# Patient Record
Sex: Female | Born: 1980
Health system: Southern US, Community
[De-identification: ages and names within clinical notes are randomized; demographics above are authoritative.]

## PROBLEM LIST (undated history)

## (undated) HISTORY — PX: AUGMENTATION MAMMAPLASTY: SUR837

---

## 2002-06-15 ENCOUNTER — Other Ambulatory Visit: Admission: RE | Admit: 2002-06-15 | Discharge: 2002-06-15 | Payer: Self-pay | Admitting: Obstetrics and Gynecology

## 2003-07-02 ENCOUNTER — Inpatient Hospital Stay (HOSPITAL_COMMUNITY): Admission: AD | Admit: 2003-07-02 | Discharge: 2003-07-02 | Payer: Self-pay | Admitting: Obstetrics and Gynecology

## 2003-08-08 ENCOUNTER — Encounter: Admission: RE | Admit: 2003-08-08 | Discharge: 2003-11-06 | Payer: Self-pay | Admitting: Obstetrics & Gynecology

## 2003-10-16 ENCOUNTER — Ambulatory Visit (HOSPITAL_COMMUNITY): Admission: RE | Admit: 2003-10-16 | Discharge: 2003-10-16 | Payer: Self-pay | Admitting: Obstetrics and Gynecology

## 2003-10-16 ENCOUNTER — Inpatient Hospital Stay (HOSPITAL_COMMUNITY): Admission: AD | Admit: 2003-10-16 | Discharge: 2003-10-16 | Payer: Self-pay | Admitting: Obstetrics and Gynecology

## 2003-10-18 ENCOUNTER — Inpatient Hospital Stay (HOSPITAL_COMMUNITY): Admission: AD | Admit: 2003-10-18 | Discharge: 2003-10-21 | Payer: Self-pay | Admitting: Obstetrics and Gynecology

## 2003-11-24 ENCOUNTER — Other Ambulatory Visit: Admission: RE | Admit: 2003-11-24 | Discharge: 2003-11-24 | Payer: Self-pay | Admitting: Obstetrics and Gynecology

## 2008-01-05 ENCOUNTER — Ambulatory Visit (HOSPITAL_COMMUNITY): Admission: RE | Admit: 2008-01-05 | Discharge: 2008-01-05 | Payer: Self-pay | Admitting: Obstetrics and Gynecology

## 2008-03-28 ENCOUNTER — Inpatient Hospital Stay (HOSPITAL_COMMUNITY): Admission: RE | Admit: 2008-03-28 | Discharge: 2008-03-30 | Payer: Self-pay | Admitting: Obstetrics and Gynecology

## 2008-03-28 ENCOUNTER — Encounter (INDEPENDENT_AMBULATORY_CARE_PROVIDER_SITE_OTHER): Payer: Self-pay | Admitting: Obstetrics and Gynecology

## 2008-04-30 ENCOUNTER — Emergency Department (HOSPITAL_COMMUNITY): Admission: EM | Admit: 2008-04-30 | Discharge: 2008-04-30 | Payer: Self-pay | Admitting: Emergency Medicine

## 2008-05-07 ENCOUNTER — Ambulatory Visit (HOSPITAL_COMMUNITY): Admission: RE | Admit: 2008-05-07 | Discharge: 2008-05-07 | Payer: Self-pay | Admitting: Family Medicine

## 2008-05-08 ENCOUNTER — Ambulatory Visit (HOSPITAL_COMMUNITY): Admission: RE | Admit: 2008-05-08 | Discharge: 2008-05-08 | Payer: Self-pay | Admitting: Gastroenterology

## 2008-05-09 ENCOUNTER — Ambulatory Visit (HOSPITAL_COMMUNITY): Admission: RE | Admit: 2008-05-09 | Discharge: 2008-05-09 | Payer: Self-pay | Admitting: Gastroenterology

## 2008-05-29 ENCOUNTER — Encounter (INDEPENDENT_AMBULATORY_CARE_PROVIDER_SITE_OTHER): Payer: Self-pay | Admitting: Surgery

## 2008-05-29 ENCOUNTER — Ambulatory Visit (HOSPITAL_COMMUNITY): Admission: RE | Admit: 2008-05-29 | Discharge: 2008-05-29 | Payer: Self-pay | Admitting: Surgery

## 2009-03-15 IMAGING — US US ABDOMEN COMPLETE
1 series · 14 of 25 positions shown · non-contrast
Comparison: None

CLINICAL DATA: Rule out obstruction of gallbladder.  Elevated LFTs.
The patient has jaundice.  Epigastric pain.

ABDOMEN ULTRASOUND
TECHNIQUE: Complete abdominal ultrasound examination was performed
including evaluation of the liver, gallbladder, bile ducts,
pancreas, kidneys, spleen, IVC, and abdominal aorta.

[Series 1: unknown · 0.38mm/px · 14 of 74 slices shown]
[im 1/74]
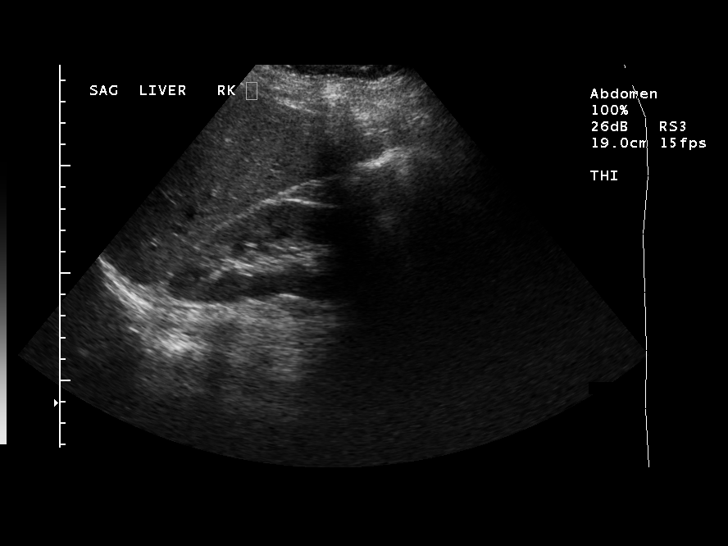
[im 7/74]
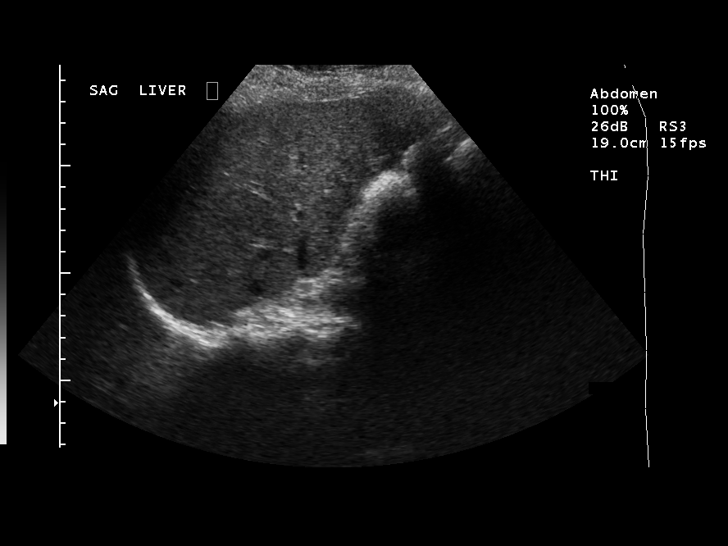
[im 13/74]
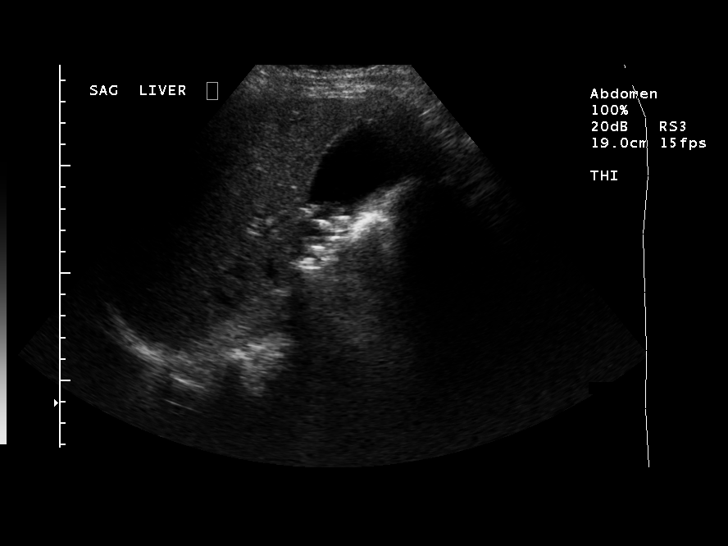
[im 19/74]
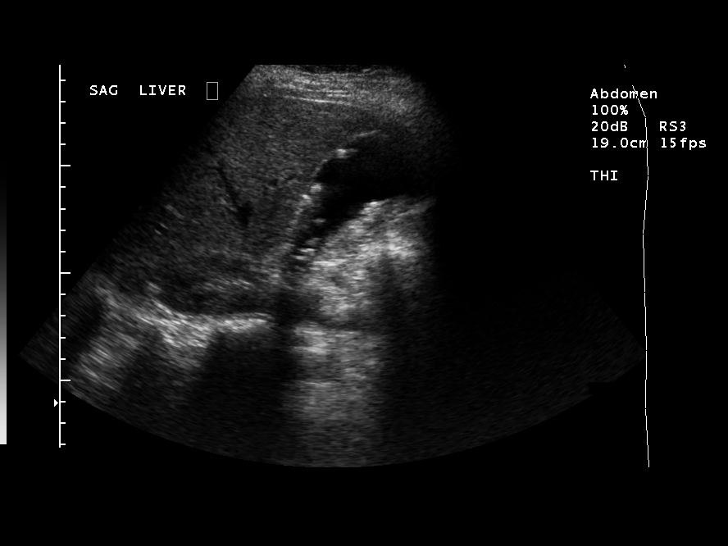
[im 25/74]
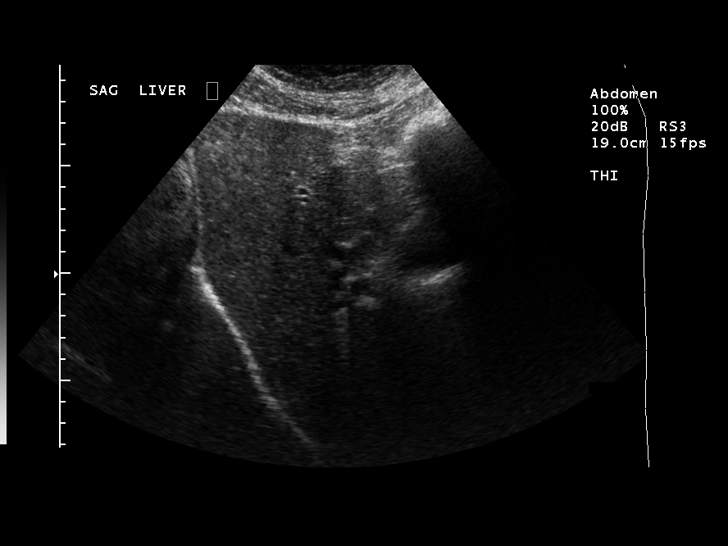
[im 28/74]
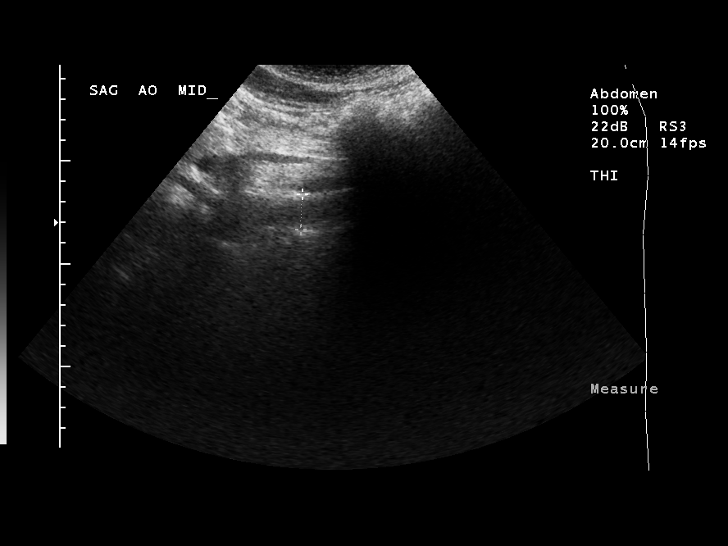
[im 34/74]
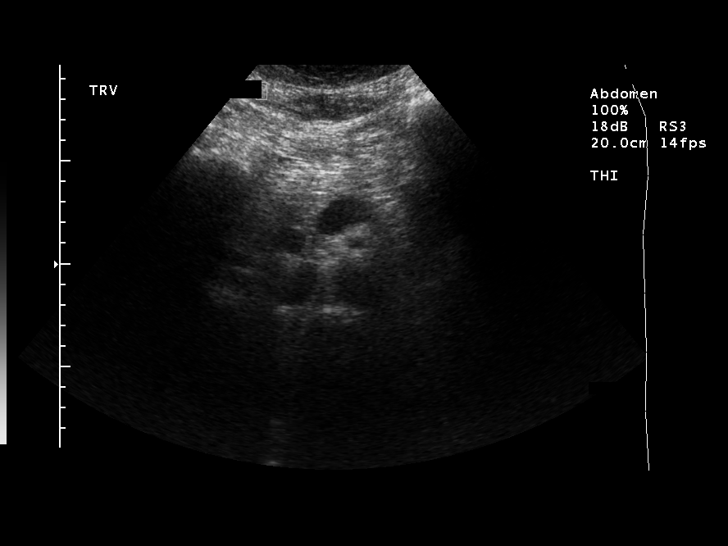
[im 40/74]
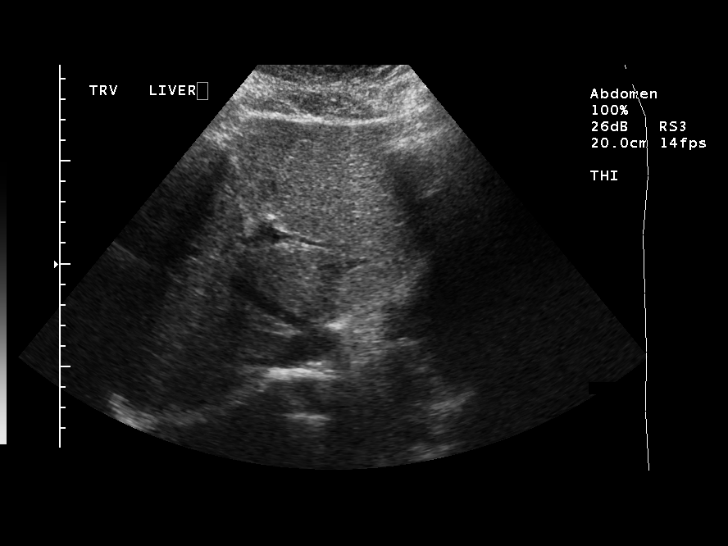
[im 46/74]
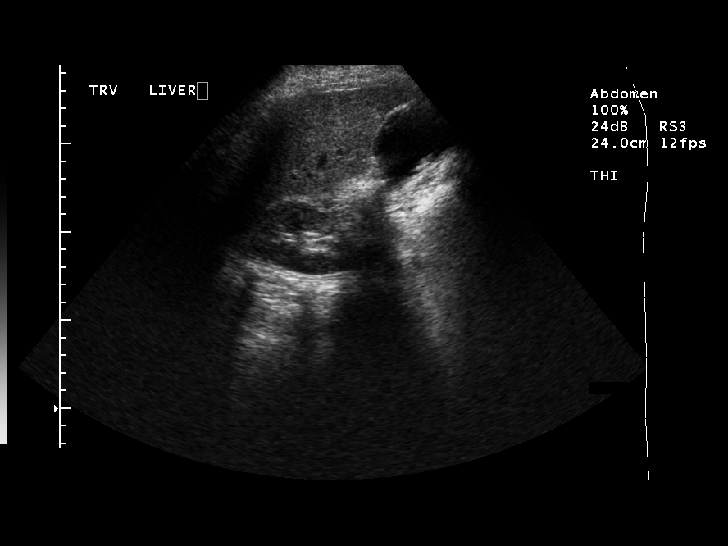
[im 49/74]
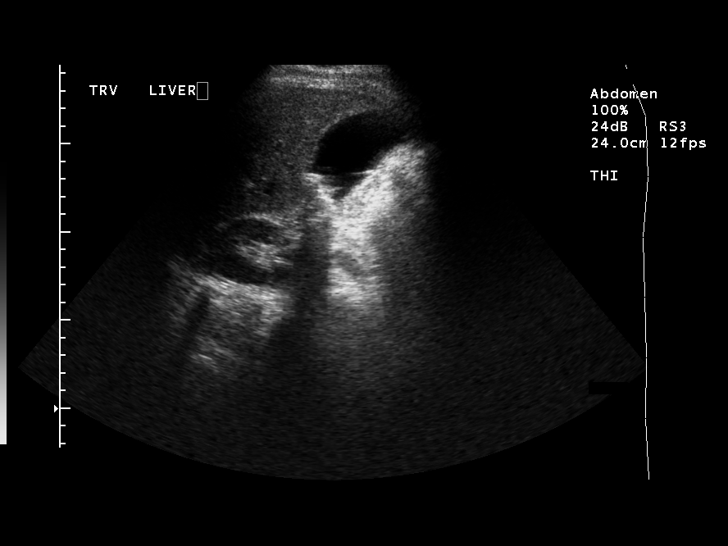
[im 55/74]
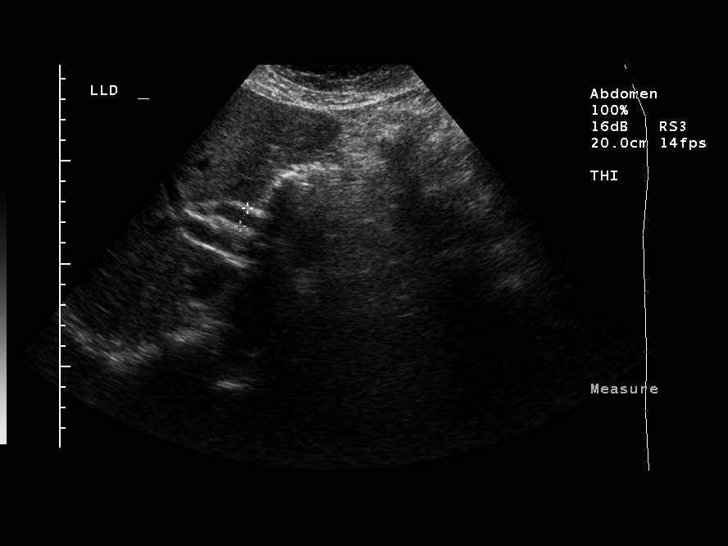
[im 61/74]
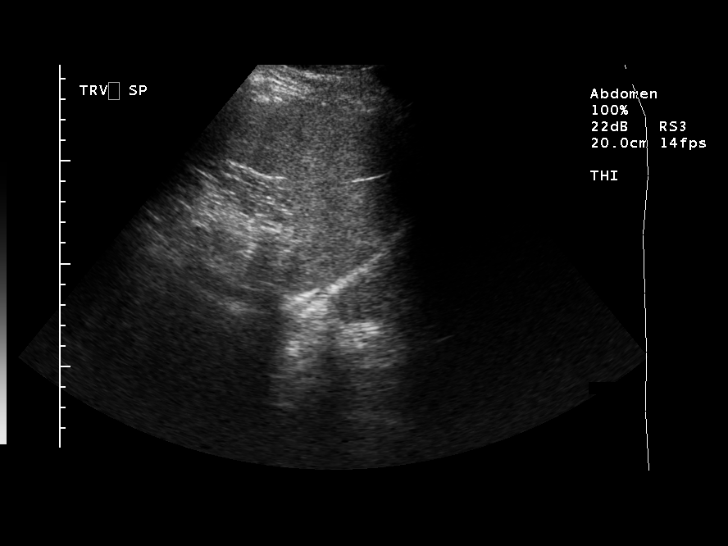
[im 67/74]
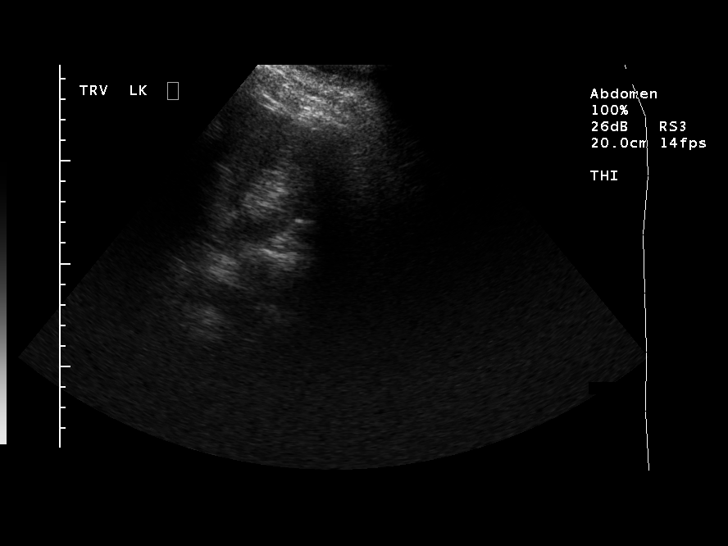
[im 74/74]
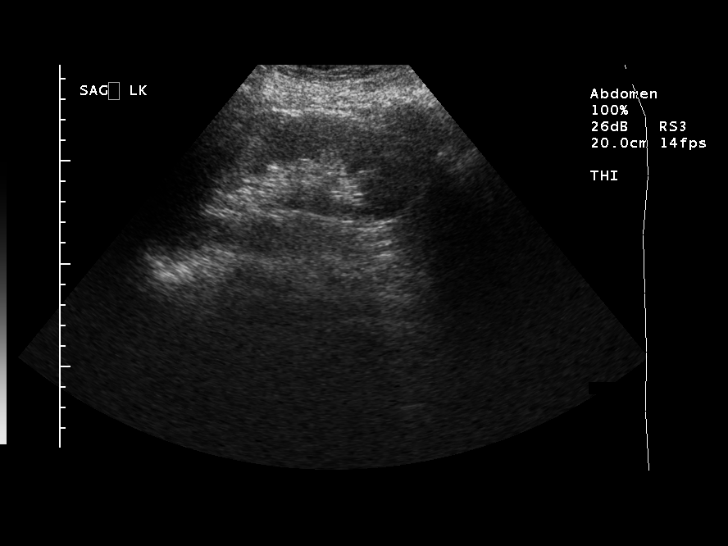

[14 of 25 positions shown; findings below may reference images not displayed]

FINDINGS: The liver is homogeneous in echotexture.  No liver mass
is identified.

Within the gallbladder, numerous stones are identified.
Gallbladder wall is normal in thickness measuring 1.6 mm.  No
sonographic Ronlor is identified.  Common bile duct is dilated
measuring 11 mm in diameter.

The inferior vena cava, visualized abdominal aorta, pancreas,
spleen, and kidneys have a normal appearance.
IMPRESSION: Gallstones and dilated common bile duct.  No sonographic Murphy's
sign.

Critical test results telephoned to Dr. Pa Tryk Baluch at the time

## 2009-03-17 IMAGING — RF DG ERCP WO/W SPHINCTEROTOMY
1 series · 5 of 5 positions shown · non-contrast
Comparison: Ultrasound 05/07/2008

CLINICAL DATA: Cough pain.

ERCP

[Series 1: run · 5 of 5 slices shown]
[im 1/5]
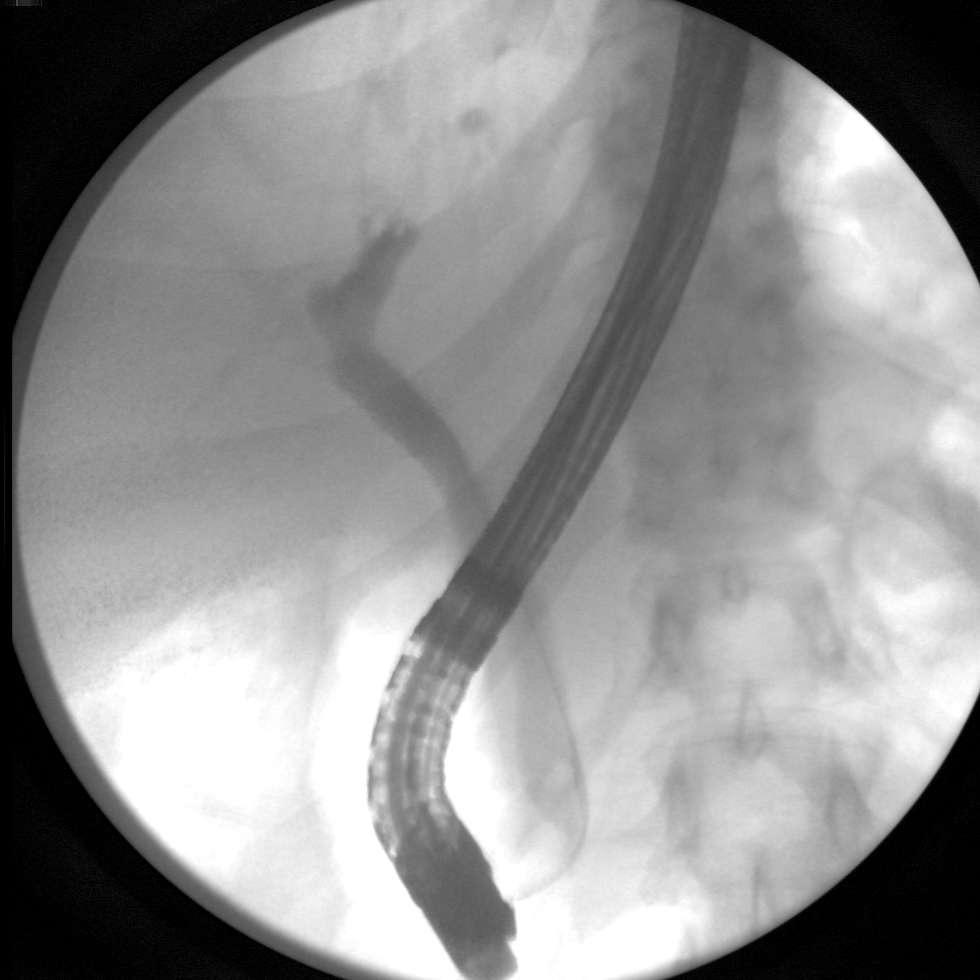
[im 2/5]
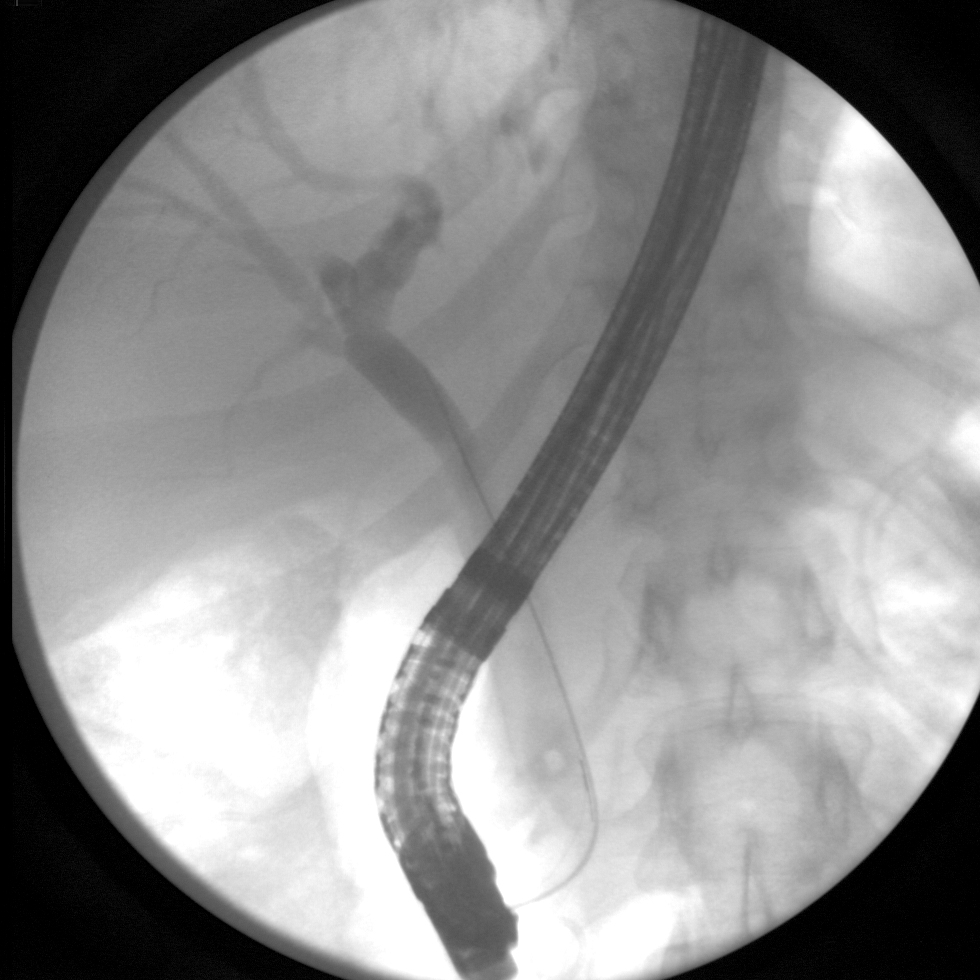
[im 3/5]
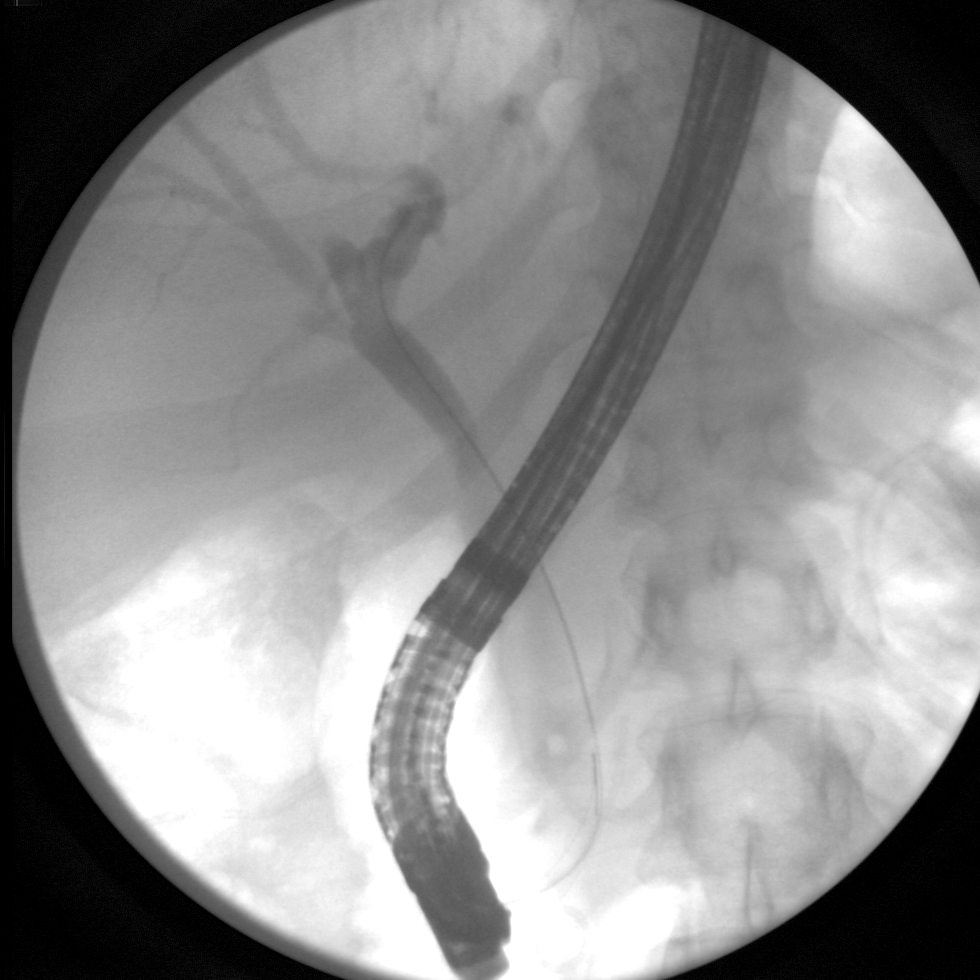
[im 4/5]
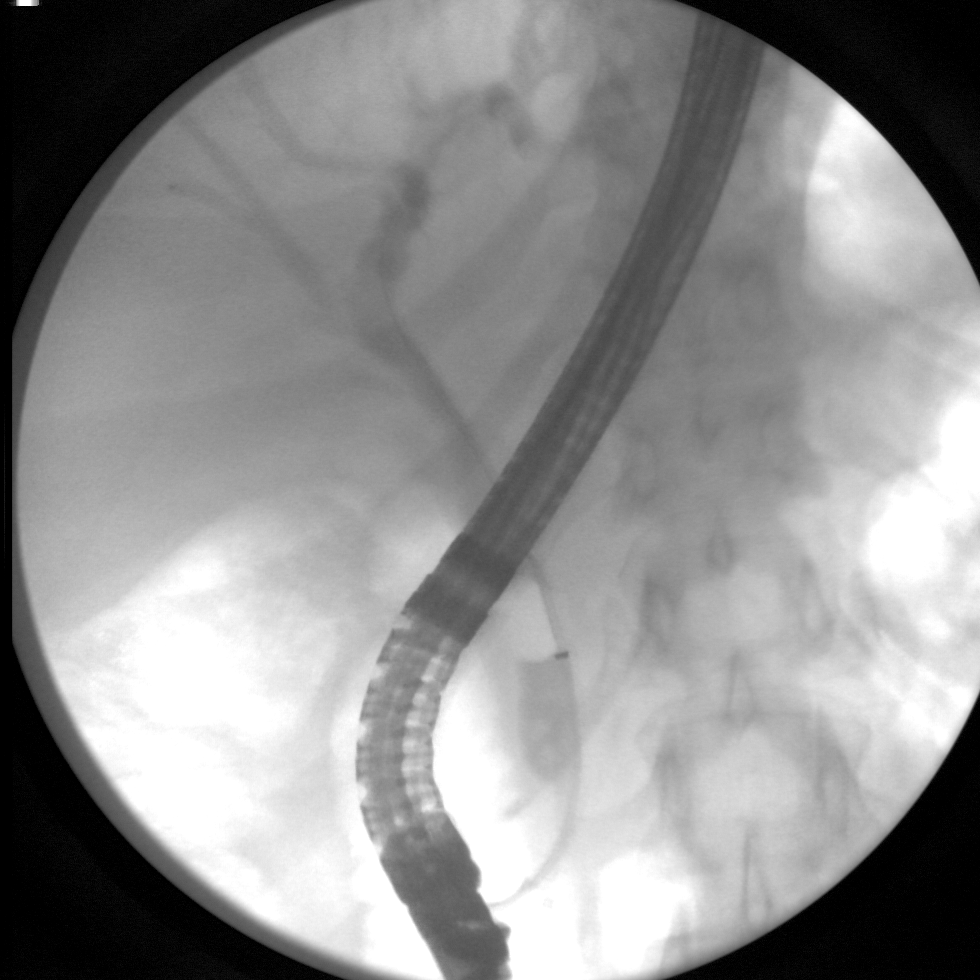
[im 5/5]
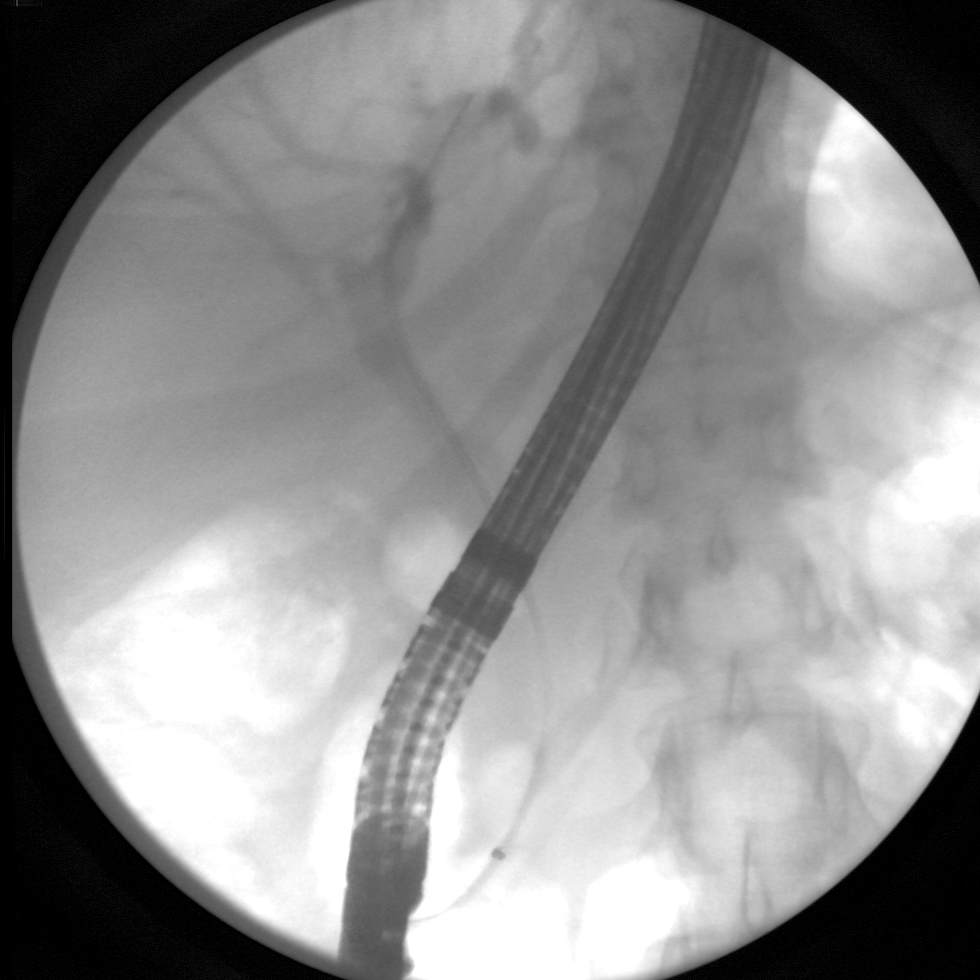

[5 of 5 positions shown; findings below may reference images not displayed]

FINDINGS: Contrast injection of the biliary ducts  revealed filling
defects in the distal duct.  Balloon occlusal catheter was inserted
and sphincterotomy was performed.  The calculi were reportedly
extracted into the duodenum.
IMPRESSION: Successful sphincterotomy with stone extraction by Lorine. Qedmaliyev Peterburq.

## 2010-07-30 ENCOUNTER — Other Ambulatory Visit: Payer: Self-pay | Admitting: Family Medicine

## 2010-07-30 ENCOUNTER — Ambulatory Visit
Admission: RE | Admit: 2010-07-30 | Discharge: 2010-07-30 | Disposition: A | Discharge status: Home / Self Care | Payer: BC Managed Care – PPO | Source: Ambulatory Visit | Attending: Family Medicine | Admitting: Family Medicine

## 2010-07-30 DIAGNOSIS — R0609 Other forms of dyspnea: Secondary | ICD-10-CM

## 2010-07-30 DIAGNOSIS — R0989 Other specified symptoms and signs involving the circulatory and respiratory systems: Secondary | ICD-10-CM

## 2010-08-12 LAB — POCT CARDIAC MARKERS
CKMB, poc: <1 ng/mL — ABNORMAL LOW (ref 1.0–8.0)
Myoglobin, poc: 55.6 ng/mL (ref 12–200)

## 2010-08-12 LAB — COMPREHENSIVE METABOLIC PANEL
Alkaline Phosphatase: 119 U/L — ABNORMAL HIGH (ref 39–117)
CO2: 22 mEq/L (ref 19–32)
Calcium: 6.9 mg/dL — ABNORMAL LOW (ref 8.4–10.5)
Chloride: 114 mEq/L — ABNORMAL HIGH (ref 96–112)
Creatinine, Ser: 0.45 mg/dL (ref 0.4–1.2)
GFR calc Af Amer: >60 mL/min (ref >60)
GFR calc non Af Amer: >60 mL/min (ref >60)
Glucose, Bld: 110 mg/dL — ABNORMAL HIGH (ref 70–99)
Potassium: 2.8 mEq/L — ABNORMAL LOW (ref 3.5–5.1)
Sodium: 141 mEq/L (ref 135–145)
Total Bilirubin: 2.7 mg/dL — ABNORMAL HIGH (ref 0.3–1.2)

## 2010-08-12 LAB — CBC: Platelets: 167 10*3/uL (ref 150–400)

## 2010-08-12 LAB — D-DIMER, QUANTITATIVE: D-Dimer, Quant: 0.46 ug/mL-FEU (ref 0.00–0.48)

## 2010-08-13 LAB — HEMOGLOBIN AND HEMATOCRIT, BLOOD
HCT: 37.7 % (ref 36.0–46.0)
Hemoglobin: 12.8 g/dL (ref 12.0–15.0)

## 2010-09-10 NOTE — Op Note (Signed)
Zoe [name], Zoe [name]                ACCOUNT NO.:  [id number]   MEDICAL RECORD NO.:  [id number]          PATIENT TYPE:  OIB   LOCATION:  2860                         FACILITY:  [facility]   PHYSICIAN:  John C. Madilyn Fireman, M.D.    DATE OF BIRTH:  May 11, 1980   DATE OF PROCEDURE:  05/08/2008  DATE OF DISCHARGE:                               OPERATIVE REPORT   OPERATION:  Esophagogastroduodenoscopy.   INDICATION FOR PROCEDURE:  Suspected common bile duct stone.   PROCEDURE:  The patient was placed in the left lateral decubitus  position and placed on the pulse monitor with continuous low-flow oxygen  delivered by nasal cannula.  She was sedated with 125 mcg IV fentanyl  and 12.5 mg IV Versed as well as 6.25 mg IV Phenergan.  The Olympus side-  viewing endoscope was advanced blindly into the oropharynx and  esophagus.  Shortly after entering into the stomach, there was  encountered a large amount of solid food material.  This was semi-solid  and broken up and to some degree be suctioned out.  However, there  continued be a fair amount of it even after a suctioned 400 mL into the  canister.  At this point, I elected to terminate the procedure due to  perceived possible risk of aspiration.  The scope was then withdrawn,  and the patient returned to the recovery room in stable condition.  She  tolerated the procedure well.  There were no immediate complications.   IMPRESSION:  Aborted endoscopic retrograde cholangiopancreatography  attempt due to food in the stomach.   PLAN:  We will have to reschedule for another day with a longer period  of being n.p.o.           ______________________________  Everardo All. Madilyn Fireman, M.D.     JCH/MEDQ  D:  05/08/2008  T:  05/09/2008  Job:  [id number]   cc:   Duncan Dull, M.D.

## 2010-09-10 NOTE — Op Note (Signed)
[name], [name] NO.:  [id number]   MEDICAL RECORD NO.:  [id number]          PATIENT TYPE:  INP   LOCATION:  9128                          FACILITY:  [facility]   PHYSICIAN:  Carrington Clamp, M.D. DATE OF BIRTH:  1980/11/04   DATE OF PROCEDURE:  DATE OF DISCHARGE:                               OPERATIVE REPORT   PREOPERATIVE DIAGNOSIS:  Previous cesarean section at term.   POSTOPERATIVE DIAGNOSIS:  Previous cesarean section at term.   PROCEDURE:  Low transverse cesarean section.   SURGEON:  Carrington Clamp, MD.   ASSISTANTFreddrick March. Tenny Craw, MD   ANESTHESIA:  Spinal.   FINDINGS:  Female infant, Apgars 9 and 9, weight 9 pounds 8 ounces.  Normal tubes, ovaries, uterus seen.   SPECIMEN:  Placenta to Pathology.   BLOOD LOSS:  800 mL.   IV FLUIDS:  2500 mL.   URINE OUTPUT:  100 mL.   MEDICATIONS:  Ancef and Pitocin.   COUNTS:  Correct x3.   TECHNIQUE:  After adequate spinal anesthesia was achieved, the patient  was prepped and draped in the usual sterile fashion in dorsal supine  position with a leftward tilt.  A Pfannenstiel skin incision was made  with a scalpel and carried down to the fascia with the Bovie cautery.  Fascia was incised in the midline with the scalpel and incised in a  transverse curvilinear manner with the Mayo scissors.  The fascia was  reflected superior and inferiorly from the rectus muscle.  The rectus  muscle was split in the midline.  The bowel free portion of peritoneum  was entered into bluntly and the peritoneum was opened superior and  inferiorly with good visualization of the bowel and bladder.   The bladder blade was placed.  The vesicouterine fascia tented up and  incised in a transverse curvilinear manner.  The bladder flap was  created bluntly, and the bladder blade replaced.  A 2-cm incision was  made in the upper portion of the lower uterine segment until clear fluid  was noted on entry into the amnion.  The baby  was identified in the  vertex presentation.  Kiwi vacuum was placed and had 2 pop offs to try  to effect delivery.  However, delivery was effected manually without  complication.  The nuchal cord was reduced, and the baby's cord was  clamped and cut.  The baby was handed to the awaiting Pediatrics.  The  placenta was delivered manually, and the cord bloods obtained by the  cord blood donation people.   The uterine incisions were closed with running locked stitch of 0  Monocryl.  In an imbricating layer, 0 Monocryl was performed as well.  The uterus was reapproximated in the abdomen, and the abdomen cleared of  all debris with irrigation.  The uterine incision was reinspected and  found to be hemostatic.  The peritoneum was then closed with running  stitch of 2-0 Vicryl.  This incorporated the rectus muscles as a  separate layer.  The fascia was closed with running stitch of 0 Vicryl.  Subcutaneous tissue was  rendered hemostatic with Bovie cautery  irrigation.  This layer was then closed with interrupted stitches of 2-0  plain gut.  The skin was closed with staples.  The patient tolerated the  procedure well, was returned to recovery room in stable condition.      Carrington Clamp, M.D.  Electronically Signed     MH/MEDQ  D:  03/28/2008  T:  03/29/2008  Job:  [id number]

## 2010-09-10 NOTE — Op Note (Signed)
Zoe [name], Zoe [name]                ACCOUNT NO.:  [id number]   MEDICAL RECORD NO.:  [id number]          PATIENT TYPE:  AMB   LOCATION:  ENDO                         FACILITY:  [facility]   PHYSICIAN:  John C. Madilyn Fireman, M.D.    DATE OF BIRTH:  1980/08/13   DATE OF PROCEDURE:  05/09/2008  DATE OF DISCHARGE:                               OPERATIVE REPORT   PROCEDURE:  Endoscopic retrograde cholangiopancreatography with  sphincterotomy, stone extraction and stent placement.   INDICATION FOR PROCEDURE:  Gallstones and suspected common bile duct  stones with obstructive jaundice.   PROCEDURE IN DETAIL:  The patient was placed in the prone position and  placed on the pulse monitor with continuous low-flow oxygen delivered by  nasal cannula.  Risks including but not limited to pancreatitis and  bleeding were explained to the patient prior to the procedure and she  wished to proceed.  The Olympus side-viewing endoscope was advanced  blindly into the oropharynx and esophagus and into the stomach.  The  stomach was empty.  The pylorus was traversed and papilla of Vater  located on the medial duodenal wall.  It was somewhat protuberant,  otherwise had a normal appearance.  Initial attempts to cannulate  resulted in deep cannulation of the pancreatic duct both with the  guidewire and with dye.  Multiple attempts over extended period of time  failed to achieve selective cannulation of the common bile duct.  After  several unsuccessful attempts, I elected to place a 3-cm, 4-French  pigtail pancreatic stent to try to assist facilitation of selective  cannulation of the common bile duct.  This was eventually achieved with  a guidewire, confirmed with cholangiogram which showed a moderately  dilated common bile duct and 1-2 distal filling defects consistent with  stones.  After a large sphincterotomy was performed, an adjustable  balloon catheter 12-15 mm advanced up into the common hepatic duct,  inflated,  and pulled down with one significant size stone seemed to be  delivered.  Two further balloon sweeps did not result in any further  stones being delivered and no further stones were seen on cholangiogram  at the end of the procedure.  The scope was then withdrawn.  It was  decided to leave the stent in place for now due to the prolonged  attempts with resultant entries into the pancreatic duct to help with  protect against procedure-related pancreatitis.   IMPRESSION:  Common bile duct stone status post sphincterotomy and  extraction with balloon.   PLAN:  Referral to Dr. Karie Soda for surgery and if pancreatic stent  does not fall out spontaneously, we will need at some point endoscopic  removal.           ______________________________  Everardo All. Madilyn Fireman, M.D.     JCH/MEDQ  D:  05/09/2008  T:  05/10/2008  Job:  [id number]   cc:   Ardeth Sportsman, MD  Duncan Dull, M.D.

## 2010-09-10 NOTE — Op Note (Signed)
Zoe [name], Zoe [name] NO.:  [id number]   MEDICAL RECORD NO.:  [id number]          PATIENT TYPE:  AMB   LOCATION:  DAY                          [facility]ACILITY:  [facility]   PHYSICIAN:  Ardeth Sportsman, MD     DATE OF BIRTH:  1980-10-17   DATE OF PROCEDURE:  DATE OF DISCHARGE:                               OPERATIVE REPORT   PRIMARY CARE PHYSICIAN:  Duncan Dull, M.D.   GASTROENTEROLOGIST:  Dorena Cookey, M.D.   SURGEON:  Ardeth Sportsman, MD.   ASSISTANT:  Sandria Bales. Ezzard Standing, M.D.   PREOPERATIVE DIAGNOSES:  1. Choledocholithiasis, status post endoscopic retrograde      cholangiopancreatography and pancreatic stent placement.  2. Symptomatic cholecystolithiasis, probable chronic cholecystitis.   POSTOPERATIVE DIAGNOSES:  1. Choledocholithiasis, status post endoscopic retrograde      cholangiopancreatography and pancreatic stent placement.  2. Symptomatic cholecystolithiasis, probable chronic cholecystitis.   PROCEDURE PERFORMED:  Laparoscopic cholecystectomy with intraoperative  cholangiogram.   ANESTHESIA:  1. General anesthesia.  2. Local anesthetic in a field block for all port sites.   SPECIMEN:  Gallbladder.   DRAINS:  None.   ESTIMATED BLOOD LOSS:  Less than 10 mL.   COMPLICATIONS:  None major.   INDICATIONS:  Zoe [name] is a pleasant 30 year old female who was found  to have jaundice and choledocholithiasis.  She underwent endoscopic  retrograde cholangiopancreatography with sphincterotomy, stone  extraction and pancreatic stent placement on May 09, 2008, by Dr.  Dorena Cookey.  Postoperatively, she recovered.  She was sent to me for  surgical consultation for cholecystectomy to remove the source of the  stones.   Anatomy and physiology of hepatobiliary and pancreatic function was  discussed.  Pathophysiology of cholecystolithiasis with his history of  choledocholithiasis, gallstone pancreatitis, et Karie Soda were discussed in  detail.  Options discussed  and recommendations made for laparoscopic  cholecystectomy with intraoperative cholangiogram.  The risks, benefits  and alternatives discussed, questions answered and she agreed to  proceed.   A discussion was made with Dr. Dorena Cookey and he wished that we make  special consideration to see if the pancreatic stents had spontaneously  migrated or persisted, requiring an EGD be done.   OPERATIVE FINDINGS:  She had some mild/moderate gallbladder wall  thickening concerning for chronic cholecystitis.  She did have small  stones that were clumped.  Her cystic duct was narrow, but her  cholangiogram was otherwise normal.  There was no evidence of any  remaining pancreatic duct.   DESCRIPTION OF PROCEDURE:  Informed consent was confirmed.  Because the  patient had a possible stent placed, gave her IV antibiotics.  She  voided just prior to going to the operating room.  She had sequential  compression devices active during the entire case.  She underwent  general anesthesia without any difficulty.  She was placed supine with  both arms tucked.  Her abdomen was prepped and draped in a sterile  fashion.   A 5-mm port was placed in the right upper quadrant using optical entry  technique with the patient in steep reversed Trendelenburg  and right-  side up.  Camera inspection revealed no intra-abdominal injury.  Under  direct visualization, 5-mm ports were placed in the right upper quadrant  and through the inferior part of the umbilicus.  A 10-mm port was placed  through in the epigastric region through the falciform ligament.   Gallbladder fundus was grasped and elevated cephalad.  Peritoneal  coverings between the liver and the gallbladder were freed on the  anteromedial and posterolateral wall walls of the gallbladder.  Circumferential dissection was done to free the proximal third of the  gallbladder off the liver bed to get a good classic critical view.  I  was able to skeletonize wispy  tissues and come to two major structures  going from the gallbladder down to the porta hepatis.  One was on the  anteromedial wall, consistent with the major anterior branch of the  cystic artery.  One clip on the gallbladder side and two clips slightly  proximally were placed to this and anterior cystic artery transection  was completed.  I was able to find a posterior branch that was a little  more wispy and this was easily controlled with cauterization after being  well skeletonized.   This left one structure going from the gallbladder down the porta  hepatis, consistent with the cystic duct.  Two clips were placed in the  infundibulum and a partial cyst ductotomy was performed.  A 5-French  cholangiocatheter was placed through the right subcostal stab incision  and placed in the cystic duct and flushed well.   A cholangiogram was run using diluted radio-opaque contrast and  continuous fluoroscopy.  Contrast flowed well from a side branch  consistent with cystic duct cannulization.  Contrast refluxed up the  common hepatic duct into the right and left intrahepatic chains without  any difficulty.  It tapered down the common bile duct to a normal  appearing ampulla.  There might have been a little bit of reflux in the  bile duct.  This was consistent with a normal cholangiogram.  Meticulous  inspection was made and there was no evidence of any persistent  choledocholithiasis.   Please note, that we did a couple spot films and saw no evidence of any  radio-opaque markers and correlated this also after the cholangiogram.  There was no evidence of any persistent pancreatic stent.  I called Dr.  Jerl [name] office and left a message with his office, and he was in  the middle of a procedure to relay that to him.   The cholangiocatheter was removed.  Four clips were placed in the cystic  duct just proximal to cystic ductotomy and cystic transection was  completed.  The gallbladder freed  from its remaining attachments on the  liver bed.  There was a little bit of spillage of bile, so we placed it  inside an EndoCatch bag and brought it out the subxiphoid port with some  gentle dilation.  The fascial defect in the subxiphoid region was large  enough to allow my pinky to pass even though it been tunneled and  angled, so I used a 0 Vicryl stitch using a laparoscopic suture passer  under direct visualization to help close the fascial defect.   Copious irrigation was done with clear return at the end.  Hemostasis  was ensured.  The clips were intact on the cystic duct and arterial  stumps and there was no of any bleeding or any leak of bile.  Again, no  evidence  of any injury elsewhere.  Capnoperitoneum was evacuated and  ports removed.  The skin was closed using Monocryl stitch.  Sterile  dressings were applied.  The patient was extubated and sent to the  recovery room in stable condition.   I explained postoperative care with the patient in the office and then  again just prior to surgery.  I am about to discuss them with her  husband per her wishes.      Ardeth Sportsman, MD  Electronically Signed     SCG/MEDQ  D:  05/29/2008  T:  05/29/2008  Job:  [id number]   cc:   Duncan Dull, M.D.  Fax: [phone]   Everardo All. Madilyn Fireman, M.D.  Fax: [phone]

## 2010-09-10 NOTE — Consult Note (Signed)
Zoe [name], Zoe [name]                ACCOUNT NO.:  [id number]   MEDICAL RECORD NO.:  [id number]          PATIENT TYPE:  OIB   LOCATION:  2860                         FACILITY:  [facility]   PHYSICIAN:  John C. Madilyn Fireman, M.D.    DATE OF BIRTH:  Aug 13, 1980   DATE OF CONSULTATION:  DATE OF DISCHARGE:  05/08/2008                                 CONSULTATION   REASON FOR CONSULTATION:  Jaundice and itching.   HISTORY OF PRESENT ILLNESS:  The patient is a 30 year old white female,  recently status post childbirth a month ago, who was not breast-feeding,  presents after having a chest and abdominal pain about a week ago, which  resolved with normal EKG, chest x-ray, and ER evaluation, but  subsequently, she noticed darkening of her urine and eventually jaundice  and itching without recurrence of pain.  Dr. Maurice Small called me  after she presented to [facility] on Friday night, and her  labs came back the next day showing a bilirubin of 4.5, alkaline  phosphatase 206, AST 66, and ALT 272.  She was sent over for an  abdominal ultrasound, which showed gallstones.  No thickening or  dilation of the gallbladder and some common bile duct dilatation and was  referred here for ERCP.   PAST MEDICAL HISTORY:  Essentially unrevealing.   SOCIAL HISTORY:  The patient denies alcohol or tobacco use.  She is  married and a Veterinary surgeon.   ALLERGIES:  None known.   FAMILY HISTORY:  Noncontributory.   PHYSICAL EXAMINATION:  GENERAL:  A well-developed, well-nourished white  female with minimally detectable scleral icterus, in no acute distress.  HEART:  Regular rate and rhythm without murmur.  LUNGS:  Clear.  ABDOMEN:  Soft, nondistended with normoactive bowel sounds.  No  hepatosplenomegaly, mass, or guarding.   IMPRESSION:  Obstructive jaundice, common bile duct stone highly  suspect.   PLAN:  We will proceed with an ERCP.  Risks, rationale, and alternatives  were explained, as well as eventual  need for cholecystectomy and  possibility of failure of ERCP to be successful, possibly necessitating  open cholecystectomy and common bile duct exploration.  This will be  done later today.           ______________________________  Everardo All. Madilyn Fireman, M.D.    JCH/MEDQ  D:  05/09/2008  T:  05/10/2008  Job:  [id number]   cc:   Ardeth Sportsman, MD  Duncan Dull, M.D.

## 2010-09-13 NOTE — Discharge Summary (Signed)
NAMEJENAVI, Zoe [name] NO.:  [id number]   MEDICAL RECORD NO.:  [id number]                   PATIENT TYPE:  INP   LOCATION:  9122                                 FACILITY:  [facility]   PHYSICIAN:  Ilda Mori, M.D.                DATE OF BIRTH:  [birth year]   DATE OF ADMISSION:  10/18/2003  DATE OF DISCHARGE:  10/21/2003                                 DISCHARGE SUMMARY   FINAL DIAGNOSES:  1. Intrauterine pregnancy at 39-6/[redacted] weeks gestation.  2. Pregnancy induced hypertension.  3. Arrest of descent.   PROCEDURE:  Primary low transverse cesarean section.   SURGEON:  Carrington Clamp, M.D.   COMPLICATIONS:  None.   HISTORY OF PRESENT ILLNESS:  This 30 year old G1, P0 presents at 39-6/[redacted]  weeks gestation in early labor.  The patient had a history of some elevated  blood pressures which were monitored during her pregnancy.  The patient also  had gestational diabetes mellitus which is being diet controlled.  Upon  admission, the patient's urine was negative for protein.  Her blood  pressures were in the 130/90 range, and her PIH labs were within normal  limits.  The patient was admitted at this time.  The patient dilated to  complete and complete and was pushing.  Despite giving a bit effort of  pushing, there was a lack of descent which indicates the baby could be  bigger than suspected.  This was discussed with the patient, and decision  was made to proceed with cesarean section.  At this point, on October 18, 2003,  the patient was taken for a primary low transverse cesarean section which  was performed by Dr. Carrington Clamp.  The patient had delivery of an 8  pound 8 ounce female infant with Apgars of 8 and 9 in OA presentation.  The  delivery went without complications.  The patient's postoperative course was  complicated by some increased vaginal bleeding.  The patient was examined.  There were no external lacerations, only old blood in vaginal vault.   There  was no active bleeding noted.  The patient's hemoglobin did drop to 8.6 from  a preoperative 12.5.  She was given two boluses of D-5 LR fluids and was  continued to be watched.  By postoperative day #1, her bleeding was  decreased.  By postoperative day #3, she was filling better.  She was still  with some moderate vaginal bleeding.  Her hemoglobin had dropped to 7.3.  She was on her iron but stable.  By postoperative day #3, the patient was  feeling much better, ready for discharge.  Her hemoglobin had gone back up  to 8.1.  She was about ready for discharge.  She was sent home on a regular  diet, told to decrease activities, told to continue her prenatal vitamins  and her iron supplement.  Was given Tylox #20 1-2 q.4h. p.r.n. pain.  Follow  up in the office in 4 weeks.  Call with any increase in bleeding, pain or  temperatures.   DISCHARGE LABORATORIES:  Hemoglobin 7.3, white blood cell count 12.9,  platelets 120,000.  Like I said before, normal pH panel.     Leilani Able, P.A.-C.                Ilda Mori, M.D.    MB/MEDQ  D:  11/06/2003  T:  11/06/2003  Job:  [id number]

## 2010-09-13 NOTE — Discharge Summary (Signed)
Zoe [name], Zoe [name] NO.:  [id number]   MEDICAL RECORD NO.:  [id number]          PATIENT TYPE:  EMS   LOCATION:  MAJO                         FACILITY:  [facility]   PHYSICIAN:  Kendra H. Tenny Craw, MD     DATE OF BIRTH:  Jan 15, 1981   DATE OF ADMISSION:  03/28/2008  DATE OF DISCHARGE:  03/30/2008                               DISCHARGE SUMMARY   FINAL DIAGNOSIS:  Intrauterine pregnancy at term, history of previous  cesarean section.  The patient desires repeat cesarean section.   PROCEDURE:  Repeat low transverse cesarean section.   SURGEON:  Carrington Clamp, MD   ASSISTANT:  Freddrick March. Tenny Craw, MD   COMPLICATIONS:  None.   This 30 year old G2 P1-0-0-1 presents at term for repeat cesarean  section.  The patient's antepartum course up to this point had been  complicated by gestational diabetes mellitus, which was able to be diet-  controlled, also left renal pyelectasis was noted.  The patient had a  consult with Maternal Fetal Medicine for evaluation.  No other  complications with this pregnancy.  The patient was taken to the  operating room on March 28, 2008, by Dr. Carrington Clamp where a  repeat low transverse cesarean section was performed with the delivery  of a 9 pound 8 ounce female infant with Apgars of 9 and 9.  Delivery  went without complications.  The patient's postoperative course was  benign without any significant fevers.  The patient was felt ready for  discharge on postoperative day #2.  She was sent home on a regular diet,  told to decrease activities, told to continue her prenatal vitamins, was  given Percocet one to two every 4-6 hours as needed for pain, wants to  follow up in our office on May 02, 2007, for her staple removal.   LABORATORY DATA ON DISCHARGE:  The patient had a hemoglobin of 9.9,  white blood cell count of 8.9, and platelets of 118,000.      Leilani Able, P.A.-C.      Freddrick March. Tenny Craw, MD  Electronically  Signed    MB/MEDQ  D:  05/03/2008  T:  05/04/2008  Job:  [id number]

## 2010-09-13 NOTE — Op Note (Signed)
Zoe [name], BICKLE NO.:  [id number]   MEDICAL RECORD NO.:  [id number]                   PATIENT TYPE:  INP   LOCATION:  9122                                 FACILITY:  [facility]   PHYSICIAN:  Carrington Clamp, M.D.              DATE OF BIRTH:  1981-02-14   DATE OF PROCEDURE:  10/18/2003  DATE OF DISCHARGE:                                 OPERATIVE REPORT   PREOPERATIVE DIAGNOSES:  Arrest of descent.   POSTOPERATIVE DIAGNOSES:  Arrest of descent.   PROCEDURE:  Primary low transverse cesarean section.   SURGEON:  Carrington Clamp, M.D.   ANESTHESIA:  Epidural.   ESTIMATED BLOOD LOSS:  500 mL.   IV FLUIDS:  1600 mL.   URINE OUTPUT:  50 mL.   COMPLICATIONS:  None.   FINDINGS:  Female infant, weight 8 pounds 8 ounces, Apgar 8 & 9, vertex in  the OA presentation.  There were normal tubes, ovaries and uterus.   MEDICATIONS:  Pitocin and Ancef.   PATHOLOGY:  None.   COUNTS:  Correct x3.   TECHNIQUE:  After adequate epidural anesthesia was achieved, the patient was  prepped and draped in the usual sterile fashion in dorsal supine position  with leftward tilt.  A Pfannenstiel skin incision was made with a scalpel  and carried down to the fascia with the Bovie cautery. The fascia was  incised with the midline with the scalpel and carried in a transverse  curvilinear manner with the Mayo scissors. The fascia was reflected  superiorly and inferiorly from the rectus muscles and the rectus muscle  split in the midline.   __________ portion of the peritoneum was then entered into carefully with  blunt and sharp dissection.  The peritoneum was then incised superiorly and  inferiorly in a manner with Metzenbaum scissors with good visualization of  the bowel and the bladder.  The bladder blade was placed and vesicouterine  fascia tinted up and incised in a transverse curvilinear manner with the  Metzenbaum scissors.  The bladder flap was created with  blunt dissection and  the bladder blade replaced.  A 2 cm transverse incision was made in the  upper portion of the lower uterine segment until clear fluid was noted upon  __________ and the bandage scissors were used to extend the incision.  The  baby is identified in the vertex presentation and delivered without  complications.  The cord was clamped and cut and the baby was bulb suctioned  and then handed to waiting pediatrics.   The placenta was then delivered manually in two pieces and the uterus was  then exteriorized, wrapped in wet lap, cleared of all debris. There was some  bleeding occurring at the time but with blotting and suction and careful  exposure with the ring forceps, the uterine incision was identified away  from the cervix. The uterine incision was then closed with a running  locked  stitch of #0 Monocryl. An imbricating layer of #0 Monocryl was performed and  hemostasis was achieved.   The uterus was reapproximated in the abdomen and the gut is cleared of all  debris with irrigation. The peritoneum is then closed with a running stitch  of 2-0 Vicryl.  This incorporated on the way back superior of the rectus  muscles. The fascia was then closed with a running stitch of #0 Vicryl. The  subcutaneous tissue was rendered hemostatic with the Bovie cautery and  irrigation and then closed with interrupted sutures of 2-0 plain gut. The  skin was closed with staples.  The patient tolerated the procedure well and  was returned to the recovery room in stable condition.                                               Carrington Clamp, M.D.    MH/MEDQ  D:  10/18/2003  T:  10/19/2003  Job:  [id number]

## 2011-01-28 LAB — BASIC METABOLIC PANEL
Chloride: 104 mEq/L (ref 96–112)
Creatinine, Ser: 0.47 mg/dL (ref 0.4–1.2)
GFR calc Af Amer: >60 mL/min (ref >60)
Glucose, Bld: 93 mg/dL (ref 70–99)

## 2011-01-28 LAB — CBC
Hemoglobin: 12.4 g/dL (ref 12.0–15.0)
MCV: 88.1 fL (ref 78.0–100.0)
Platelets: 140 10*3/uL — ABNORMAL LOW (ref 150–400)
WBC: 12 10*3/uL — ABNORMAL HIGH (ref 4.0–10.5)

## 2011-01-31 LAB — CBC
HCT: 28.5 % — ABNORMAL LOW (ref 36.0–46.0)
MCHC: 34.9 g/dL (ref 30.0–36.0)
MCV: 87.2 fL (ref 78.0–100.0)
MCV: 88.8 fL (ref 78.0–100.0)
Platelets: 118 10*3/uL — ABNORMAL LOW (ref 150–400)
RDW: 13.3 % (ref 11.5–15.5)
WBC: 12.5 10*3/uL — ABNORMAL HIGH (ref 4.0–10.5)
WBC: 8.9 10*3/uL (ref 4.0–10.5)

## 2011-01-31 LAB — GLUCOSE, CAPILLARY: Glucose-Capillary: 83 mg/dL (ref 70–99)

## 2011-03-25 ENCOUNTER — Other Ambulatory Visit: Payer: Self-pay | Admitting: Obstetrics and Gynecology

## 2015-12-27 DIAGNOSIS — Z1231 Encounter for screening mammogram for malignant neoplasm of breast: Secondary | ICD-10-CM | POA: Diagnosis not present

## 2016-01-14 DIAGNOSIS — Z01818 Encounter for other preprocedural examination: Secondary | ICD-10-CM | POA: Diagnosis not present

## 2016-01-14 DIAGNOSIS — Z0000 Encounter for general adult medical examination without abnormal findings: Secondary | ICD-10-CM | POA: Diagnosis not present

## 2016-01-14 DIAGNOSIS — E785 Hyperlipidemia, unspecified: Secondary | ICD-10-CM | POA: Diagnosis not present

## 2016-04-01 DIAGNOSIS — R0789 Other chest pain: Secondary | ICD-10-CM | POA: Diagnosis not present

## 2016-06-04 DIAGNOSIS — Z01419 Encounter for gynecological examination (general) (routine) without abnormal findings: Secondary | ICD-10-CM | POA: Diagnosis not present

## 2016-06-04 DIAGNOSIS — Z124 Encounter for screening for malignant neoplasm of cervix: Secondary | ICD-10-CM | POA: Diagnosis not present

## 2016-06-04 DIAGNOSIS — Z6829 Body mass index (BMI) 29.0-29.9, adult: Secondary | ICD-10-CM | POA: Diagnosis not present

## 2016-12-22 DIAGNOSIS — D2271 Melanocytic nevi of right lower limb, including hip: Secondary | ICD-10-CM | POA: Diagnosis not present

## 2016-12-22 DIAGNOSIS — D485 Neoplasm of uncertain behavior of skin: Secondary | ICD-10-CM | POA: Diagnosis not present

## 2016-12-22 DIAGNOSIS — D1801 Hemangioma of skin and subcutaneous tissue: Secondary | ICD-10-CM | POA: Diagnosis not present

## 2016-12-22 DIAGNOSIS — D225 Melanocytic nevi of trunk: Secondary | ICD-10-CM | POA: Diagnosis not present

## 2016-12-22 DIAGNOSIS — L821 Other seborrheic keratosis: Secondary | ICD-10-CM | POA: Diagnosis not present

## 2016-12-22 DIAGNOSIS — D2262 Melanocytic nevi of left upper limb, including shoulder: Secondary | ICD-10-CM | POA: Diagnosis not present

## 2017-01-15 DIAGNOSIS — R7309 Other abnormal glucose: Secondary | ICD-10-CM | POA: Diagnosis not present

## 2017-01-15 DIAGNOSIS — Z0000 Encounter for general adult medical examination without abnormal findings: Secondary | ICD-10-CM | POA: Diagnosis not present

## 2017-01-15 DIAGNOSIS — E785 Hyperlipidemia, unspecified: Secondary | ICD-10-CM | POA: Diagnosis not present

## 2017-09-01 DIAGNOSIS — Z124 Encounter for screening for malignant neoplasm of cervix: Secondary | ICD-10-CM | POA: Diagnosis not present

## 2017-09-01 DIAGNOSIS — Z6826 Body mass index (BMI) 26.0-26.9, adult: Secondary | ICD-10-CM | POA: Diagnosis not present

## 2017-09-01 DIAGNOSIS — Z01419 Encounter for gynecological examination (general) (routine) without abnormal findings: Secondary | ICD-10-CM | POA: Diagnosis not present

## 2018-01-29 DIAGNOSIS — E785 Hyperlipidemia, unspecified: Secondary | ICD-10-CM | POA: Diagnosis not present

## 2018-01-29 DIAGNOSIS — Z0000 Encounter for general adult medical examination without abnormal findings: Secondary | ICD-10-CM | POA: Diagnosis not present

## 2018-01-29 DIAGNOSIS — R7309 Other abnormal glucose: Secondary | ICD-10-CM | POA: Diagnosis not present

## 2018-05-08 DIAGNOSIS — M5489 Other dorsalgia: Secondary | ICD-10-CM | POA: Diagnosis not present

## 2018-09-09 DIAGNOSIS — Z3202 Encounter for pregnancy test, result negative: Secondary | ICD-10-CM | POA: Diagnosis not present

## 2018-09-09 DIAGNOSIS — Z6827 Body mass index (BMI) 27.0-27.9, adult: Secondary | ICD-10-CM | POA: Diagnosis not present

## 2018-09-09 DIAGNOSIS — Z30433 Encounter for removal and reinsertion of intrauterine contraceptive device: Secondary | ICD-10-CM | POA: Diagnosis not present

## 2018-11-11 DIAGNOSIS — J4520 Mild intermittent asthma, uncomplicated: Secondary | ICD-10-CM | POA: Diagnosis not present

## 2018-11-23 DIAGNOSIS — Z01419 Encounter for gynecological examination (general) (routine) without abnormal findings: Secondary | ICD-10-CM | POA: Diagnosis not present

## 2018-11-23 DIAGNOSIS — Z6827 Body mass index (BMI) 27.0-27.9, adult: Secondary | ICD-10-CM | POA: Diagnosis not present

## 2018-11-29 DIAGNOSIS — J45909 Unspecified asthma, uncomplicated: Secondary | ICD-10-CM | POA: Diagnosis not present

## 2019-04-28 DIAGNOSIS — Z03818 Encounter for observation for suspected exposure to other biological agents ruled out: Secondary | ICD-10-CM | POA: Diagnosis not present

## 2019-04-28 DIAGNOSIS — Z20828 Contact with and (suspected) exposure to other viral communicable diseases: Secondary | ICD-10-CM | POA: Diagnosis not present

## 2019-05-25 ENCOUNTER — Encounter (HOSPITAL_COMMUNITY): Payer: Self-pay

## 2019-05-25 DIAGNOSIS — J3089 Other allergic rhinitis: Secondary | ICD-10-CM | POA: Diagnosis not present

## 2019-05-25 DIAGNOSIS — J301 Allergic rhinitis due to pollen: Secondary | ICD-10-CM | POA: Diagnosis not present

## 2019-05-25 DIAGNOSIS — R0602 Shortness of breath: Secondary | ICD-10-CM | POA: Diagnosis not present

## 2019-05-25 DIAGNOSIS — L2089 Other atopic dermatitis: Secondary | ICD-10-CM | POA: Diagnosis not present

## 2019-05-25 NOTE — Progress Notes (Signed)
Pt scheduled for CPX and covid screen per Dr Rennis Chris office.  Letter for CPX test and covid information mailed to patient.

## 2019-06-03 ENCOUNTER — Other Ambulatory Visit (HOSPITAL_COMMUNITY): Payer: Self-pay

## 2019-06-06 ENCOUNTER — Encounter (HOSPITAL_COMMUNITY): Payer: Self-pay

## 2019-06-25 DIAGNOSIS — Z20822 Contact with and (suspected) exposure to covid-19: Secondary | ICD-10-CM | POA: Diagnosis not present

## 2019-06-27 DIAGNOSIS — R7309 Other abnormal glucose: Secondary | ICD-10-CM | POA: Diagnosis not present

## 2019-06-27 DIAGNOSIS — R0789 Other chest pain: Secondary | ICD-10-CM | POA: Diagnosis not present

## 2019-06-29 ENCOUNTER — Other Ambulatory Visit (HOSPITAL_COMMUNITY): Payer: Self-pay | Admitting: Family Medicine

## 2019-06-29 DIAGNOSIS — R0789 Other chest pain: Secondary | ICD-10-CM

## 2019-07-05 ENCOUNTER — Telehealth (HOSPITAL_COMMUNITY): Payer: Self-pay | Admitting: Family Medicine

## 2019-07-05 NOTE — Telephone Encounter (Signed)
I called patient to schedule Echocardiogram and she declined to schedule due to she was having another Pulmonary stress test ordered by her Allergy doctor.  I called the Ordering Dr . Farris Has and informed his office.  Order will be removed from the WQ.   Thea Alken

## 2019-07-22 ENCOUNTER — Other Ambulatory Visit (HOSPITAL_COMMUNITY)
Admission: RE | Admit: 2019-07-22 | Discharge: 2019-07-22 | Disposition: A | Discharge status: Home / Self Care | Payer: BC Managed Care – PPO | Source: Ambulatory Visit | Attending: Allergy and Immunology | Admitting: Allergy and Immunology

## 2019-07-22 DIAGNOSIS — Z01812 Encounter for preprocedural laboratory examination: Secondary | ICD-10-CM | POA: Diagnosis not present

## 2019-07-22 DIAGNOSIS — Z20822 Contact with and (suspected) exposure to covid-19: Secondary | ICD-10-CM | POA: Diagnosis not present

## 2019-07-22 LAB — SARS CORONAVIRUS 2 (TAT 6-24 HRS): SARS Coronavirus 2: NEGATIVE

## 2019-07-25 ENCOUNTER — Other Ambulatory Visit: Payer: Self-pay

## 2019-07-25 ENCOUNTER — Ambulatory Visit (HOSPITAL_COMMUNITY): Payer: BC Managed Care – PPO | Attending: Allergy and Immunology

## 2019-07-25 DIAGNOSIS — R0602 Shortness of breath: Secondary | ICD-10-CM | POA: Insufficient documentation

## 2019-08-16 DIAGNOSIS — Z0000 Encounter for general adult medical examination without abnormal findings: Secondary | ICD-10-CM | POA: Diagnosis not present

## 2019-08-16 DIAGNOSIS — E785 Hyperlipidemia, unspecified: Secondary | ICD-10-CM | POA: Diagnosis not present

## 2019-08-18 ENCOUNTER — Other Ambulatory Visit (HOSPITAL_COMMUNITY): Payer: Self-pay | Admitting: Family Medicine

## 2019-08-18 DIAGNOSIS — R0789 Other chest pain: Secondary | ICD-10-CM

## 2019-09-06 ENCOUNTER — Ambulatory Visit (HOSPITAL_COMMUNITY): Payer: BC Managed Care – PPO | Attending: Cardiovascular Disease

## 2019-09-06 ENCOUNTER — Other Ambulatory Visit: Payer: Self-pay

## 2019-09-06 DIAGNOSIS — R0789 Other chest pain: Secondary | ICD-10-CM | POA: Diagnosis not present

## 2019-09-06 MED ORDER — PERFLUTREN LIPID MICROSPHERE
1.0000 mL | INTRAVENOUS | Status: AC | PRN
  Administered 2019-09-06: 1 mL via INTRAVENOUS

## 2020-02-03 DIAGNOSIS — Z1231 Encounter for screening mammogram for malignant neoplasm of breast: Secondary | ICD-10-CM | POA: Diagnosis not present

## 2020-02-03 DIAGNOSIS — Z01419 Encounter for gynecological examination (general) (routine) without abnormal findings: Secondary | ICD-10-CM | POA: Diagnosis not present

## 2020-02-15 ENCOUNTER — Other Ambulatory Visit: Payer: Self-pay | Admitting: Obstetrics and Gynecology

## 2020-02-15 DIAGNOSIS — R928 Other abnormal and inconclusive findings on diagnostic imaging of breast: Secondary | ICD-10-CM

## 2020-02-27 ENCOUNTER — Ambulatory Visit: Payer: BC Managed Care – PPO

## 2020-02-27 ENCOUNTER — Ambulatory Visit
Admission: RE | Admit: 2020-02-27 | Discharge: 2020-02-27 | Disposition: A | Discharge status: Home / Self Care | Payer: BC Managed Care – PPO | Source: Ambulatory Visit | Attending: Obstetrics and Gynecology | Admitting: Obstetrics and Gynecology

## 2020-02-27 ENCOUNTER — Other Ambulatory Visit: Payer: Self-pay

## 2020-02-27 DIAGNOSIS — R928 Other abnormal and inconclusive findings on diagnostic imaging of breast: Secondary | ICD-10-CM

## 2020-02-27 DIAGNOSIS — N6489 Other specified disorders of breast: Secondary | ICD-10-CM | POA: Diagnosis not present

## 2021-01-04 IMAGING — MG DIGITAL DIAGNOSTIC BILAT W/ TOMO W/ CAD
8 series · 8 of 24 positions shown · non-contrast
Comparison: Previous exam(s).

CLINICAL DATA: 39-year-old female presenting as a recall from
screening for possible bilateral asymmetries. Of note the patient
had interval implant surgery between her prior screening mammogram
and her most recent screening mammogram.

EXAM:
DIGITAL DIAGNOSTIC BILATERAL MAMMOGRAM WITH TOMO
ULTRASOUND LEFT BREAST

[R MLO synth-2D]
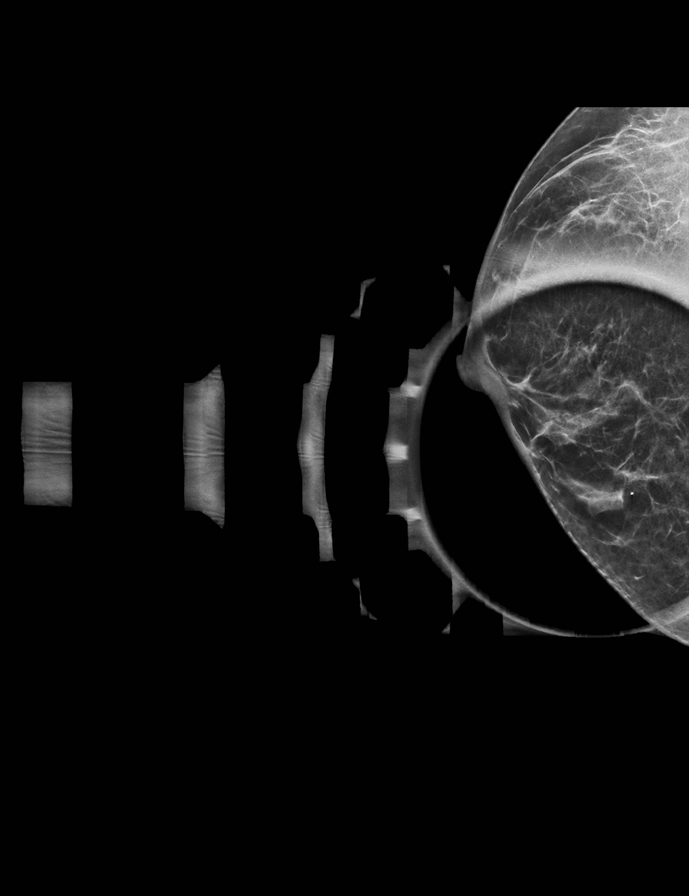

[L MLO synth-2D]
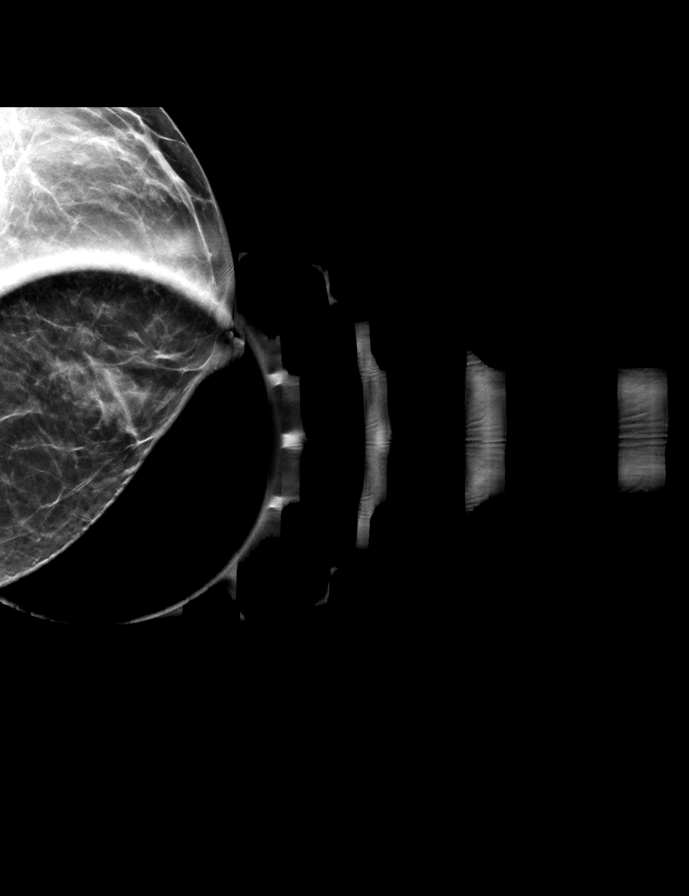

[R CC synth-2D]
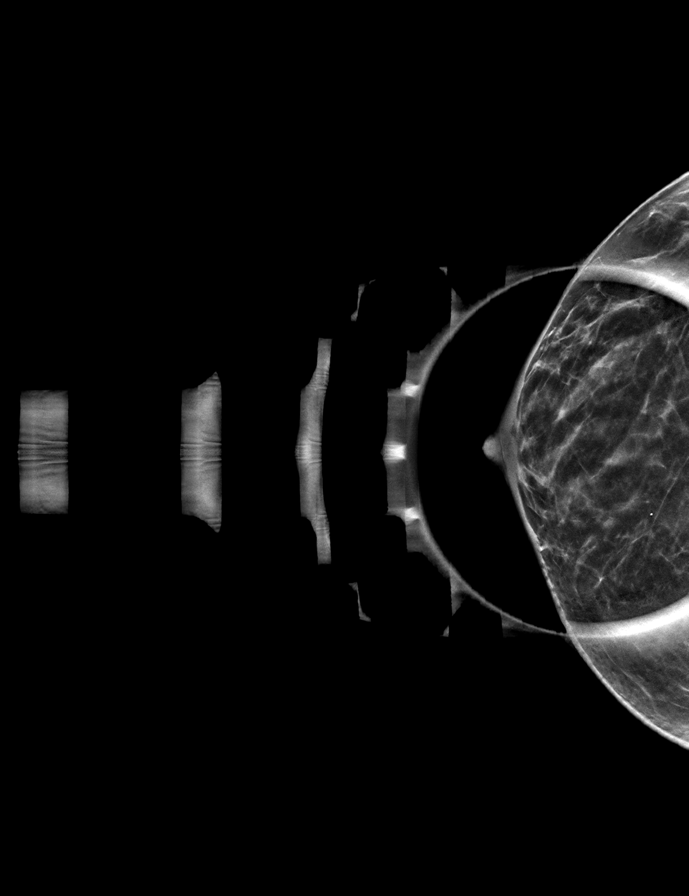

[L CC synth-2D]
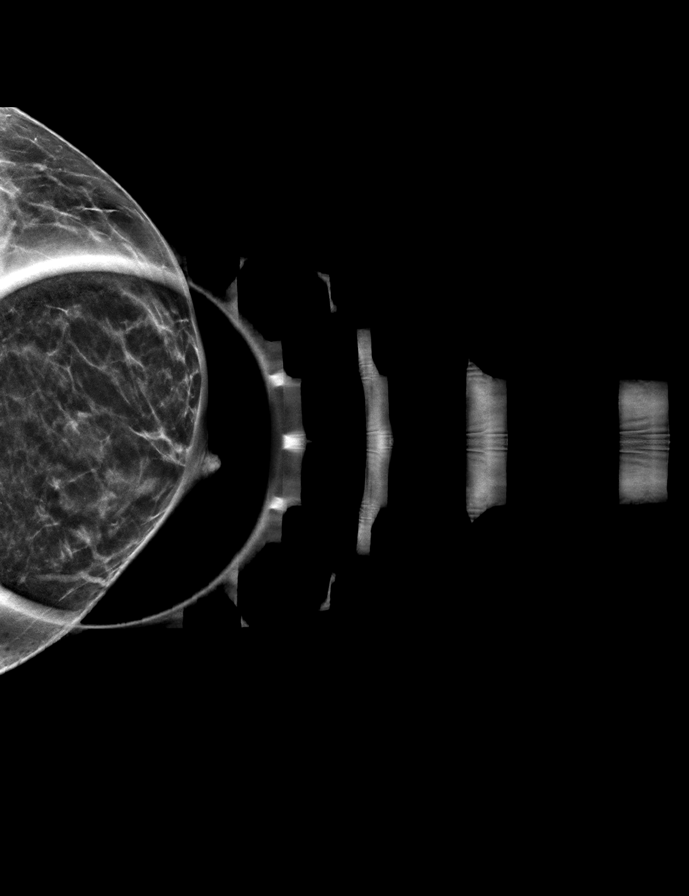

[L MLOID BREAST TOMOSYNTHESIS IMAGE tomo · tomo slice 21/42.0]
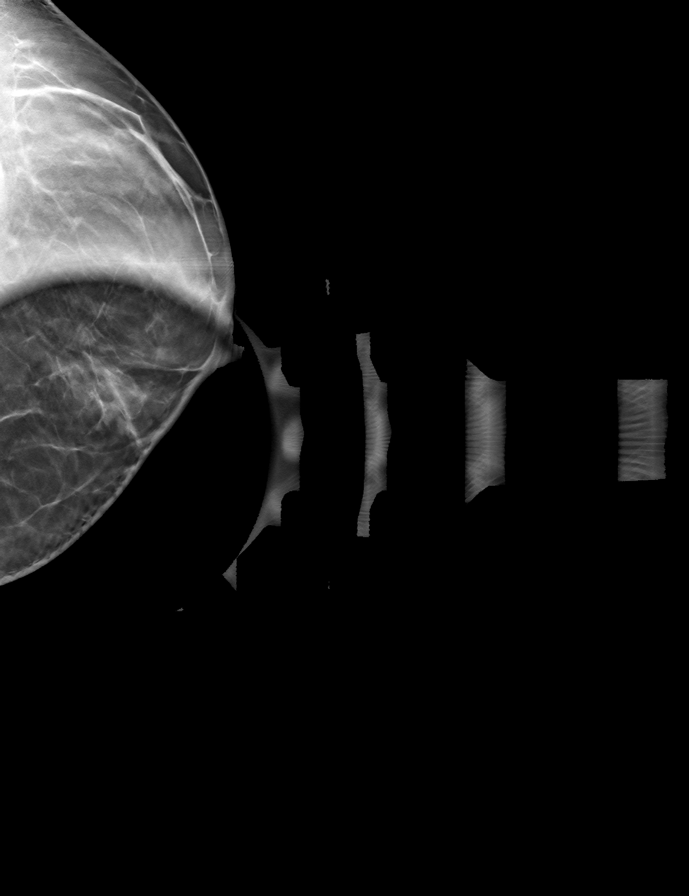

[R CCID BREAST TOMOSYNTHESIS IMAGE tomo · tomo slice 22/43.0]
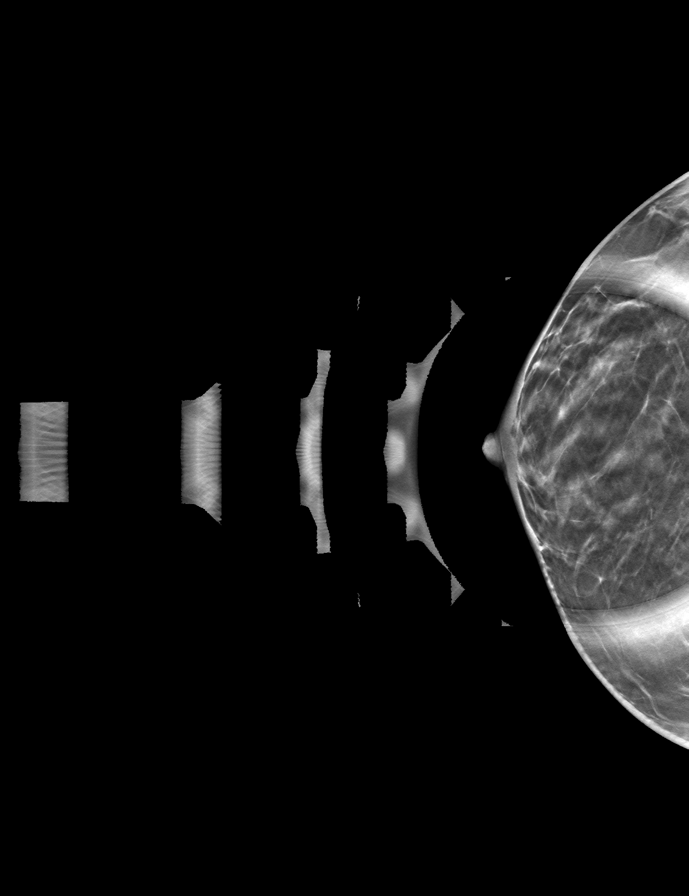

[R MLO tomo · tomo slice 21/41.0]
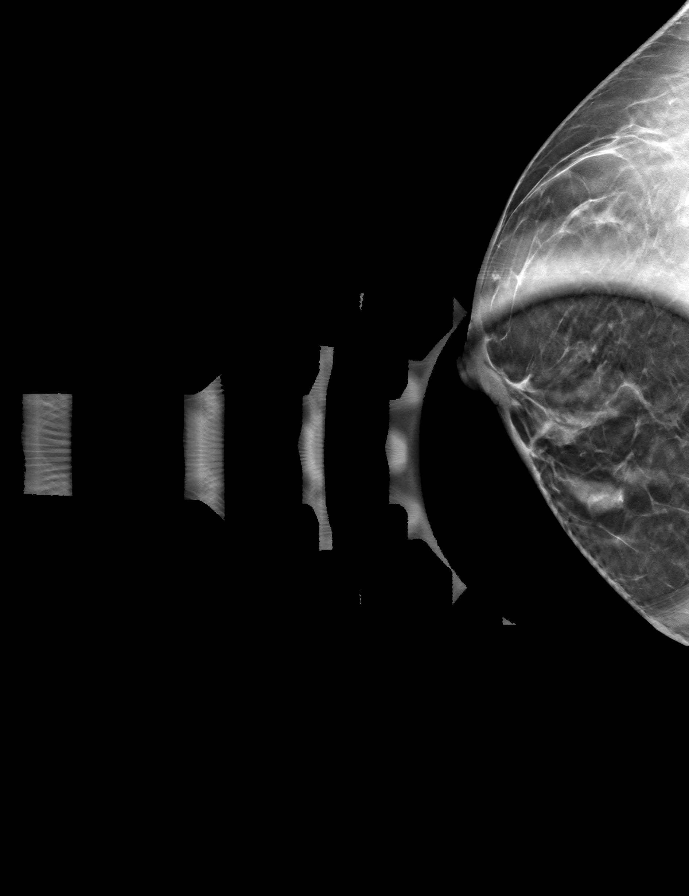

[L CCID BREAST TOMOSYNTHESIS IMAGE tomo · tomo slice 21/42.0]
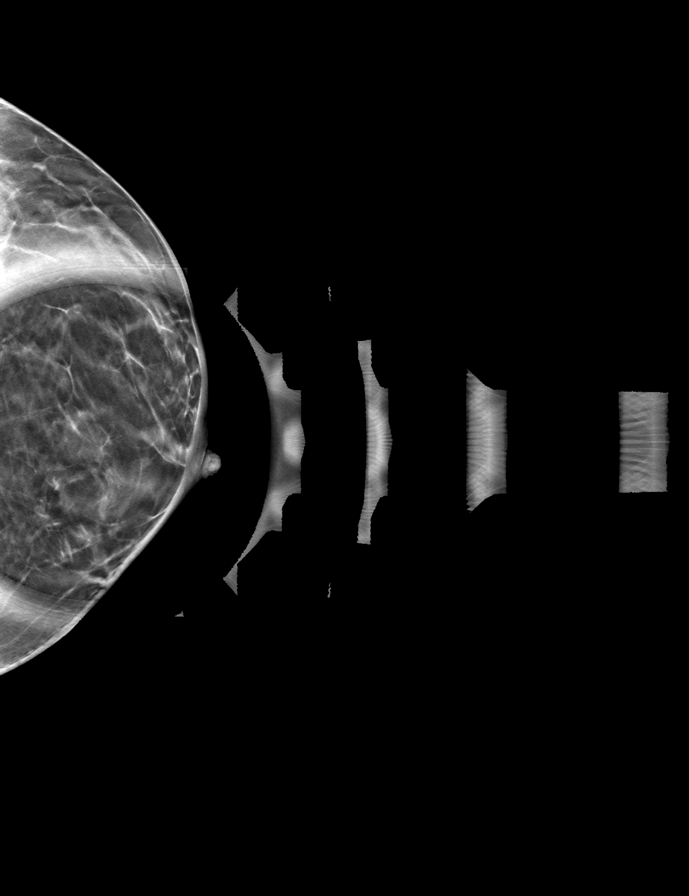

[8 of 24 positions shown; findings below may reference images not displayed]

ACR Breast Density Category b: There are scattered areas of
fibroglandular density.
FINDINGS: Mammogram:

Right breast: Spot compression tomosynthesis views of the right
breast were performed for a possible asymmetry in the lower slightly
outer right breast. On the additional imaging the tissue in this
area disperses without persistent asymmetry, mass or distortion.

Left breast: Spot compression tomosynthesis views of the left breast
performed for a possible asymmetry in the lower slightly medial left
breast. On the additional imaging the asymmetry is less conspicuous
and most likely represents normal fibroglandular tissue or scarring
from prior surgery. No suspicious mass or distortion.

Ultrasound:

Targeted ultrasound is performed throughout the lower inner aspect
of the left breast demonstrating a linear scar extending to the skin
surface at 7 o'clock. No suspicious mass or other abnormality
identified.
IMPRESSION: Benign bilateral asymmetries most likely representing redistribution
of tissue/scarring from implant surgery. No evidence of malignancy.

RECOMMENDATION:
Screening mammogram in one year.(Code:[id number]-[id number])

I have discussed the findings and recommendations with the patient.
If applicable, a reminder letter will be sent to the patient
regarding the next appointment.

BI-RADS CATEGORY  2: Benign.

## 2021-04-08 ENCOUNTER — Ambulatory Visit
Admission: RE | Admit: 2021-04-08 | Discharge: 2021-04-08 | Disposition: A | Discharge status: Home / Self Care | Payer: BC Managed Care – PPO | Source: Ambulatory Visit | Attending: Sports Medicine | Admitting: Sports Medicine

## 2021-04-08 ENCOUNTER — Other Ambulatory Visit: Payer: Self-pay | Admitting: Sports Medicine

## 2021-04-08 DIAGNOSIS — M25551 Pain in right hip: Secondary | ICD-10-CM

## 2021-04-08 DIAGNOSIS — M545 Low back pain, unspecified: Secondary | ICD-10-CM

## 2021-11-07 ENCOUNTER — Other Ambulatory Visit: Payer: Self-pay

## 2021-11-07 ENCOUNTER — Encounter (HOSPITAL_BASED_OUTPATIENT_CLINIC_OR_DEPARTMENT_OTHER): Payer: Self-pay | Admitting: Emergency Medicine

## 2021-11-07 ENCOUNTER — Emergency Department (HOSPITAL_BASED_OUTPATIENT_CLINIC_OR_DEPARTMENT_OTHER)
Admission: EM | Admit: 2021-11-07 | Discharge: 2021-11-07 | Disposition: A | Discharge status: Home / Self Care | Payer: BC Managed Care – PPO | Attending: Emergency Medicine | Admitting: Emergency Medicine

## 2021-11-07 DIAGNOSIS — T7840XA Allergy, unspecified, initial encounter: Secondary | ICD-10-CM | POA: Diagnosis present

## 2021-11-07 MED ORDER — EPINEPHRINE 0.3 MG/0.3ML IJ SOAJ: INTRAMUSCULAR | 0 refills | Status: AC

## 2021-11-07 MED ORDER — FAMOTIDINE 20 MG PO TABS
20.0000 mg | ORAL_TABLET | Freq: Once | ORAL | Status: AC
Start: 2021-11-07 — End: 2021-11-07
  Administered 2021-11-07: 20 mg via ORAL
  Filled 2021-11-07: qty 1

## 2021-11-07 MED ORDER — DEXAMETHASONE SODIUM PHOSPHATE 10 MG/ML IJ SOLN
10.0000 mg | Freq: Once | INTRAMUSCULAR | Status: AC
  Administered 2021-11-07: 10 mg via INTRAMUSCULAR
  Filled 2021-11-07: qty 1

## 2021-11-07 MED ORDER — DIPHENHYDRAMINE HCL 25 MG PO CAPS
25.0000 mg | ORAL_CAPSULE | Freq: Once | ORAL | Status: AC
  Administered 2021-11-07: 25 mg via ORAL
  Filled 2021-11-07: qty 1

## 2021-11-07 NOTE — ED Provider Notes (Signed)
[facility] Provider Note: Zoe Dell, MD, FACEP  CSN: [id number] MRN: [id number] ARRIVAL: 11/07/21 at 0316 ROOM: DB016/DB016   CHIEF COMPLAINT  Allergic Reaction   HISTORY OF PRESENT ILLNESS  11/07/21 5:00 AM Zoe [name] is a 41 y.o. female who was stung by a yellow jacket 4 days ago but had had no symptoms since that time.  Yesterday evening she began having a generalized urticarial rash companied with chest tightness.  She is not sure of what may have triggered this.  She did take 1 Benadryl tablet prior to arrival.  She has been sitting in the waiting room and now feels significantly improved.  She never had any throat swelling, shortness of breath, nausea, vomiting or diarrhea.   History reviewed. No pertinent past medical history.  Past Surgical History:  Procedure Laterality Date   AUGMENTATION MAMMAPLASTY Bilateral     History reviewed. No pertinent family history.  Social History   Tobacco Use   Smoking status: Never   Smokeless tobacco: Never  Substance Use Topics   Alcohol use: Yes   Drug use: Never    Prior to Admission medications   Medication Sig Start Date End Date Taking? Authorizing Provider  EPINEPHrine 0.3 mg/0.3 mL IJ SOAJ injection Self inject per package instructions as needed for severe allergic reaction. 11/07/21  Yes Lethia Donlon, MD    Allergies Patient has no known allergies.   REVIEW OF SYSTEMS  Negative except as noted here or in the History of Present Illness.   PHYSICAL EXAMINATION  Initial Vital Signs Blood pressure (!) 140/102, pulse 85, temperature 98.2 F (36.8 C), temperature source Oral, resp. rate 16, height 5\' 4"  (1.626 m), weight 65.8 kg, SpO2 100 %.  Examination General: Well-developed, well-nourished female in no acute distress; appearance consistent with age of record HENT: normocephalic; atraumatic; normal voice Eyes: Normal appearance Neck: supple Heart: regular rate and rhythm Lungs: clear to  auscultation bilaterally Abdomen: soft; nondistended; nontender; bowel sounds present Extremities: No deformity; full range of motion Neurologic: Awake, alert and oriented; motor function intact in all extremities and symmetric; no facial droop Skin: Warm and dry; generalized fading urticarial rash Psychiatric: Normal mood and affect   RESULTS  Summary of this visit's results, reviewed and interpreted by myself:   EKG Interpretation  Date/Time:  Thursday November 07 2021 03:31:10 EDT Ventricular Rate:  74 PR Interval:  148 QRS Duration: 98 QT Interval:  394 QTC Calculation: 437 R Axis:   83 Text Interpretation: Normal sinus rhythm Minimal voltage criteria for LVH, may be normal variant When compared with ECG of 02-Jul-2003 19:17, T wave inversion no longer evident in Inferior leads Confirmed by 04-Jul-2003 (Paula Libra) on 11/07/2021 3:35:44 AM       Laboratory Studies: No results found for this or any previous visit (from the past 24 hour(s)). Imaging Studies: No results found.  ED COURSE and MDM  Nursing notes, initial and subsequent vitals signs, including pulse oximetry, reviewed and interpreted by myself.  Vitals:   11/07/21 0323  BP: (!) 140/102  Pulse: 85  Resp: 16  Temp: 98.2 F (36.8 C)  TempSrc: Oral  SpO2: 100%  Weight: 65.8 kg  Height: 5\' 4"  (1.626 m)   Medications  famotidine (PEPCID) tablet 20 mg (has no administration in time range)  diphenhydrAMINE (BENADRYL) capsule 25 mg (has no administration in time range)  dexamethasone (DECADRON) injection 10 mg (has no administration in time range)   The cause of her reaction is unclear.  It would be unusual to have a sudden reaction 3 days after an insect sting that caused only local symptoms at the time.  She could have had another sting that she did not recognize she had.  We will give her an additional dose of Benadryl as well as Pepcid and dexamethasone.   PROCEDURES  Procedures   ED DIAGNOSES     ICD-10-CM    1. Allergic reaction, initial encounter  T78.40XA          Henrietta Cieslewicz, Jonny Ruiz, MD 11/07/21 [phone]

## 2021-11-07 NOTE — ED Triage Notes (Addendum)
Patient reports hives all over torso/back and chest that started tonight.  Patient also endorses chest tightness.  Patient reports being stung by a yellow jacket on Sunday but since then, no new meds/food/bites/stings. Patient took 1 benadryl PTA.

## 2022-09-15 ENCOUNTER — Other Ambulatory Visit: Payer: Self-pay | Admitting: Obstetrics and Gynecology

## 2022-09-15 DIAGNOSIS — Z1231 Encounter for screening mammogram for malignant neoplasm of breast: Secondary | ICD-10-CM

## 2022-10-09 ENCOUNTER — Other Ambulatory Visit: Payer: Self-pay | Admitting: Obstetrics and Gynecology

## 2022-10-09 ENCOUNTER — Ambulatory Visit
Admission: RE | Admit: 2022-10-09 | Discharge: 2022-10-09 | Disposition: A | Discharge status: Home / Self Care | Payer: BC Managed Care – PPO | Source: Ambulatory Visit | Attending: Obstetrics and Gynecology | Admitting: Obstetrics and Gynecology

## 2022-10-09 DIAGNOSIS — Z1231 Encounter for screening mammogram for malignant neoplasm of breast: Secondary | ICD-10-CM

## 2024-06-10 ENCOUNTER — Encounter (HOSPITAL_BASED_OUTPATIENT_CLINIC_OR_DEPARTMENT_OTHER): Admitting: Radiology

## 2024-06-10 DIAGNOSIS — Z1231 Encounter for screening mammogram for malignant neoplasm of breast: Secondary | ICD-10-CM
